# Patient Record
Sex: Male | Born: 2000 | Race: Black or African American | Hispanic: No | Marital: Single | Smoking: Never smoker
Health system: Southern US, Community
[De-identification: ages and names within clinical notes are randomized; demographics above are authoritative.]

## PROBLEM LIST (undated history)

## (undated) DIAGNOSIS — R011 Cardiac murmur, unspecified: Secondary | ICD-10-CM

## (undated) DIAGNOSIS — H539 Unspecified visual disturbance: Secondary | ICD-10-CM

## (undated) DIAGNOSIS — J45909 Unspecified asthma, uncomplicated: Secondary | ICD-10-CM

## (undated) DIAGNOSIS — J302 Other seasonal allergic rhinitis: Secondary | ICD-10-CM

## (undated) DIAGNOSIS — F909 Attention-deficit hyperactivity disorder, unspecified type: Secondary | ICD-10-CM

## (undated) HISTORY — PX: TONSILLECTOMY: SUR1361

## (undated) HISTORY — PX: ADENOIDECTOMY: SUR15

## (undated) HISTORY — DX: Other seasonal allergic rhinitis: J30.2

---
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
## 2019-01-04 ENCOUNTER — Emergency Department (HOSPITAL_BASED_OUTPATIENT_CLINIC_OR_DEPARTMENT_OTHER)
Admission: EM | Admit: 2019-01-04 | Discharge: 2019-01-04 | Disposition: A | Discharge status: Home / Self Care | Payer: Medicaid Other | Attending: Emergency Medicine | Admitting: Emergency Medicine

## 2019-01-04 ENCOUNTER — Emergency Department (HOSPITAL_BASED_OUTPATIENT_CLINIC_OR_DEPARTMENT_OTHER): Payer: Medicaid Other

## 2019-01-04 ENCOUNTER — Encounter (HOSPITAL_BASED_OUTPATIENT_CLINIC_OR_DEPARTMENT_OTHER): Payer: Self-pay | Admitting: *Deleted

## 2019-01-04 ENCOUNTER — Other Ambulatory Visit: Payer: Self-pay

## 2019-01-04 DIAGNOSIS — Z23 Encounter for immunization: Secondary | ICD-10-CM | POA: Diagnosis not present

## 2019-01-04 DIAGNOSIS — M25521 Pain in right elbow: Secondary | ICD-10-CM | POA: Diagnosis present

## 2019-01-04 DIAGNOSIS — Z79899 Other long term (current) drug therapy: Secondary | ICD-10-CM | POA: Insufficient documentation

## 2019-01-04 DIAGNOSIS — S59901A Unspecified injury of right elbow, initial encounter: Secondary | ICD-10-CM

## 2019-01-04 MED ORDER — TETANUS-DIPHTH-ACELL PERTUSSIS 5-2.5-18.5 LF-MCG/0.5 IM SUSP
0.5000 mL | Freq: Once | INTRAMUSCULAR | Status: AC
  Administered 2019-01-04: 0.5 mL via INTRAMUSCULAR
  Filled 2019-01-04: qty 0.5

## 2019-01-04 NOTE — Discharge Instructions (Signed)
As discussed, you x-ray is normal with no broken bones. You may take over the counter ibuprofen or tylenol as needed for pain and swelling. You can ice your elbow to help with inflammation. Follow-up with your PCP within the next week if your symptoms do not improve. Return to the ER for new or worsening symptoms.

## 2019-01-04 NOTE — ED Triage Notes (Signed)
Lift gate on large truck fell against rt elbow yesterday good movement  Slight swelling

## 2019-01-04 NOTE — ED Provider Notes (Signed)
[facility] EMERGENCY DEPARTMENT Provider Note   CSN: [id number] Arrival date & time: 01/04/19  1317     History   Chief Complaint Chief Complaint  Patient presents with  . Extremity Pain    HPI Nicholas Mcguire is a 18 y.o. male with a past medical history significant for ADHD, asthma, and seasonal allergies who presents to the ED for an evaluation of sudden onset of worsening right elbow pain x1 day. Patient notes he was at work when a lift gate from a truck fell directly on his right elbow yesterday. Patient is a Research scientist (physical sciences)  Patient notes he continued to work after the injury but had significant amount of pain.  He notes pain has drastically improved today.  Elbow pain is associated with mild swelling.  Patient denies numbness and tingling.  He rates his pain a 4/10.  He has not tried anything for pain prior to arrival.  Patient also sustained an abrasion to his right side of chest during the accident.  Patient unsure when his last tetanus shot was. Patient denies numbness/tingling and weakness.  Past Medical History:  Diagnosis Date  . ADHD (attention deficit hyperactivity disorder)   . Asthma    exercise induced asthma - no longer on inhalers  . Heart murmur    as a baby  . Premature baby   . Seasonal allergies   . Vision abnormalities    lazy eye    Patient Active Problem List   Diagnosis Date Noted  . Major depressive disorder, single episode, in remission ([place]) 07/17/2012  . ADHD (attention deficit hyperactivity disorder), combined type 07/17/2012    Past Surgical History:  Procedure Laterality Date  . ADENOIDECTOMY  age 93 mos.  Marland Kitchen PTOSIS REPAIR Left 09/01/2017   Procedure: PTOSIS REPAIR;  Surgeon: Clista Bernhardt, MD;  Location: [facility];  Service: Ophthalmology;  Laterality: Left;  . TONSILLECTOMY  age 2 mos.        Home Medications    Prior to Admission medications   Medication Sig Start Date End Date Taking? Authorizing Provider   amphetamine-dextroamphetamine (ADDERALL XR) 20 MG 24 hr capsule Take one each morning Patient not taking: Reported on 07/15/2017 08/26/16   Ethelda Chick, MD  amphetamine-dextroamphetamine (ADDERALL XR) 30 MG 24 hr capsule Take 1 capsule (30 mg total) by mouth daily. dnfu 07/21/2016 Patient not taking: Reported on 07/15/2017 05/24/16   Dara Hoyer, PA-C  mirtazapine (REMERON SOL-TAB) 15 MG disintegrating tablet Take 1 tablet (15 mg total) by mouth at bedtime. Patient not taking: Reported on 07/15/2017 08/26/16   Ethelda Chick, MD    Family History Family History  Problem Relation Age of Onset  . Bipolar disorder Mother   . Schizophrenia Sister   . Seizures Sister     Social History Social History   Tobacco Use  . Smoking status: Never Smoker  . Smokeless tobacco: Never Used  Substance Use Topics  . Alcohol use: No  . Drug use: No     Allergies   Patient has no known allergies.   Review of Systems Review of Systems  Constitutional: Negative for chills and fever.  Musculoskeletal: Positive for arthralgias and joint swelling.  Skin: Positive for color change and wound.  Neurological: Negative for weakness and numbness.     Physical Exam Updated Vital Signs BP 130/70 (BP Location: Left Arm)   Pulse 88   Temp 98.2 F (36.8 C)   Resp 16   Ht 5\' 7"  (  1.702 m)   Wt 78 kg   SpO2 100%   BMI 26.94 kg/m   Physical Exam Vitals signs and nursing note reviewed.  Constitutional:      General: He is not in acute distress.    Appearance: He is not ill-appearing.  HENT:     Head: Normocephalic.  Eyes:     Pupils: Pupils are equal, round, and reactive to light.  Neck:     Musculoskeletal: Neck supple.  Cardiovascular:     Rate and Rhythm: Normal rate and regular rhythm.     Pulses: Normal pulses.     Heart sounds: Normal heart sounds. No murmur. No friction rub. No gallop.   Pulmonary:     Effort: Pulmonary effort is normal.     Breath sounds: Normal breath sounds.   Abdominal:     General: Abdomen is flat. There is no distension.     Palpations: Abdomen is soft.     Tenderness: There is abdominal tenderness. There is no guarding or rebound.  Musculoskeletal:     Comments: Tenderness to palpation over right elbow posterior to olecranon process with mild surrounding edema. No bony tenderness. Full ROM of elbow, shoulder, and wrist. No warmth. Neurovascularly intact. Soft compartments.  Skin:    General: Skin is warm and dry.     Capillary Refill: Capillary refill takes less than 2 seconds.     Findings: Lesion present.     Comments: Small, shallow abrasion on right pectoral region. No surrounding erythema or edema.  Neurological:     General: No focal deficit present.     Mental Status: He is alert.      ED Treatments / Results  Labs (all labs ordered are listed, but only abnormal results are displayed) Labs Reviewed - No data to display  EKG None  Radiology Dg Elbow Complete Right  Result Date: 01/04/2019 CLINICAL DATA:  Pain after elbow hit by piece of metal EXAM: RIGHT ELBOW - COMPLETE 3+ VIEW COMPARISON:  None. FINDINGS: Frontal, lateral, and bilateral oblique views were obtained. No fracture or dislocation. No joint effusion. No joint space narrowing or erosion. IMPRESSION: No fracture or dislocation.  No evident arthropathy. Electronically Signed   By: Bretta Bang III M.D.   On: 01/04/2019 14:10    Procedures Procedures (including critical care time)  Medications Ordered in ED Medications  Tdap (BOOSTRIX) injection 0.5 mL (0.5 mLs Intramuscular Given 01/04/19 1353)     Initial Impression / Assessment and Plan / ED Course  I have reviewed the triage vital signs and the nursing notes.  Pertinent labs & imaging results that were available during my care of the patient were reviewed by me and considered in my medical decision making (see chart for details).       18 year old male presents to ED for evaluation of traumatic  right elbow pain. Vitals all within normal limits. Patient in no acute distress and non-ill appearing. Right elbow with tenderness to palpation posterior to olecranon process with mild edema. No erythema or warmth. Full ROM. Soft compartments. Neurovascularly intact. Right elbow x-ray personally reviewed which is negative for bony fracture and dislocation. Tetanus shot updated given abrasion to right chest. No concern for cellulitis at this time. Hemostasis achieved. No sutures or closure warranted at this time given how shallow it is. Will treat symptomatically with over the counter ibuprofen and tylenol. Patient advised to follow-up with PCP within the next week if symptoms do not improve. Strict ED precautions  discussed with patient. Patient states understanding and agrees to plan. Patient discharged home in no acute distress and stable vitals.  Final Clinical Impressions(s) / ED Diagnoses   Final diagnoses:  Injury of right elbow, initial encounter    ED Discharge Orders    None       Cheek, CarolinRenee Hardere B, PA-C 01/04/19 1954    Virgina Norfolkuratolo, Adam, DO 01/05/19 1225

## 2019-02-16 ENCOUNTER — Encounter: Payer: Self-pay | Admitting: Emergency Medicine

## 2019-02-16 ENCOUNTER — Other Ambulatory Visit: Payer: Self-pay

## 2019-02-16 ENCOUNTER — Ambulatory Visit
Admission: EM | Admit: 2019-02-16 | Discharge: 2019-02-16 | Disposition: A | Discharge status: Home / Self Care | Payer: Medicaid Other | Attending: Emergency Medicine | Admitting: Emergency Medicine

## 2019-02-16 DIAGNOSIS — Z20822 Contact with and (suspected) exposure to covid-19: Secondary | ICD-10-CM

## 2019-02-16 NOTE — ED Notes (Signed)
Patient able to ambulate independently  

## 2019-02-16 NOTE — ED Triage Notes (Signed)
Pt presents to [facility] for assessment of sore throat, cough, sneezing, runny nose, and muscle pain x 2-3 days.  Mother, who he lives with, tested positive for COVID.  Patient states today he developed dulled taste sensation.

## 2019-02-16 NOTE — ED Provider Notes (Signed)
[facility]    CSN: [id number] Arrival date & time: 02/16/19  1557      History   Chief Complaint Chief Complaint  Patient presents with  . Exposure to COVID    HPI Nicholas Mcguire is a 19 y.o. male w/ h/o asthma, adhd, allergies  Presenting for Covid testing: Exposure: mom Date of exposure: [place] Any fever, symptoms since exposure: yes - sore throat, dry cough, myalgias x 3 days.  Has not taken anything for symptoms.   Past Medical History:  Diagnosis Date  . ADHD (attention deficit hyperactivity disorder)   . Asthma    exercise induced asthma - no longer on inhalers  . Heart murmur    as a baby  . Premature baby   . Seasonal allergies   . Vision abnormalities    lazy eye    Patient Active Problem List   Diagnosis Date Noted  . Major depressive disorder, single episode, in remission (HCC) 07/17/2012  . ADHD (attention deficit hyperactivity disorder), combined type 07/17/2012    Past Surgical History:  Procedure Laterality Date  . ADENOIDECTOMY  age 77 mos.  Marland Kitchen PTOSIS REPAIR Left 09/01/2017   Procedure: PTOSIS REPAIR;  Surgeon: Floydene Flock, MD;  Location: [facility] OR;  Service: Ophthalmology;  Laterality: Left;  . TONSILLECTOMY  age 58 mos.       Home Medications    Prior to Admission medications   Medication Sig Start Date End Date Taking? Authorizing Provider  amphetamine-dextroamphetamine (ADDERALL XR) 30 MG 24 hr capsule Take 1 capsule (30 mg total) by mouth daily. dnfu 07/21/2016 05/24/16  Yes Court Joy, PA-C  mirtazapine (REMERON SOL-TAB) 15 MG disintegrating tablet Take 1 tablet (15 mg total) by mouth at bedtime. 08/26/16  Yes Gentry Fitz, MD  amphetamine-dextroamphetamine (ADDERALL XR) 20 MG 24 hr capsule Take one each morning Patient not taking: Reported on 07/15/2017 08/26/16   Gentry Fitz, MD    Family History Family History  Problem Relation Age of Onset  . Bipolar disorder Mother   . Schizophrenia Sister   .  Seizures Sister     Social History Social History   Tobacco Use  . Smoking status: Never Smoker  . Smokeless tobacco: Never Used  Substance Use Topics  . Alcohol use: No  . Drug use: No     Allergies   Patient has no known allergies.   Review of Systems Review of Systems  Constitutional: Negative for activity change, appetite change, fatigue and fever.  HENT: Positive for sore throat. Negative for congestion, dental problem, ear pain, facial swelling, hearing loss, sinus pain, trouble swallowing and voice change.   Eyes: Negative for photophobia, pain and visual disturbance.  Respiratory: Positive for cough. Negative for shortness of breath, wheezing and stridor.   Cardiovascular: Negative for chest pain and palpitations.  Gastrointestinal: Negative for diarrhea and vomiting.  Musculoskeletal: Positive for myalgias. Negative for arthralgias, neck pain and neck stiffness.  Neurological: Negative for dizziness and headaches.     Physical Exam Triage Vital Signs ED Triage Vitals  Enc Vitals Group     BP 02/16/19 1612 123/77     Pulse Rate 02/16/19 1612 85     Resp 02/16/19 1612 16     Temp 02/16/19 1612 98.5 F (36.9 C)     Temp Source 02/16/19 1612 Temporal     SpO2 02/16/19 1612 97 %     Weight --      Height --  Head Circumference --      Peak Flow --      Pain Score 02/16/19 1613 0     Pain Loc --      Pain Edu? --      Excl. in GC? --    No data found.  Updated Vital Signs BP 123/77 (BP Location: Left Arm)   Pulse 85   Temp 98.5 F (36.9 C) (Temporal)   Resp 16   SpO2 97%   Visual Acuity Right Eye Distance:   Left Eye Distance:   Bilateral Distance:    Right Eye Near:   Left Eye Near:    Bilateral Near:     Physical Exam Constitutional:      General: He is not in acute distress.    Appearance: He is not toxic-appearing or diaphoretic.  HENT:     Head: Normocephalic and atraumatic.     Mouth/Throat:     Mouth: Mucous membranes are  moist.     Pharynx: Oropharynx is clear. No oropharyngeal exudate or posterior oropharyngeal erythema.  Eyes:     General: No scleral icterus.    Conjunctiva/sclera: Conjunctivae normal.     Pupils: Pupils are equal, round, and reactive to light.  Neck:     Comments: Trachea midline, negative JVD Cardiovascular:     Rate and Rhythm: Normal rate and regular rhythm.  Pulmonary:     Effort: Pulmonary effort is normal. No respiratory distress.     Breath sounds: No wheezing.  Musculoskeletal:     Cervical back: Neck supple. No tenderness.  Lymphadenopathy:     Cervical: No cervical adenopathy.  Skin:    Capillary Refill: Capillary refill takes less than 2 seconds.     Coloration: Skin is not jaundiced or pale.     Findings: No rash.  Neurological:     Mental Status: He is alert and oriented to person, place, and time.      UC Treatments / Results  Labs (all labs ordered are listed, but only abnormal results are displayed) Labs Reviewed  NOVEL CORONAVIRUS, NAA    EKG   Radiology No results found.  Procedures Procedures (including critical care time)  Medications Ordered in UC Medications - No data to display  Initial Impression / Assessment and Plan / UC Course  I have reviewed the triage vital signs and the nursing notes.  Pertinent labs & imaging results that were available during my care of the patient were reviewed by me and considered in my medical decision making (see chart for details).     Patient afebrile, nontoxic, with SpO2 97%.  Covid PCR pending.  Patient to quarantine until results are back.  We will continue supportive management.  Return precautions discussed, patient verbalized understanding and is agreeable to plan. Final Clinical Impressions(s) / UC Diagnoses   Final diagnoses:  Exposure to COVID-19 virus     Discharge Instructions     Your COVID test is pending - it is important to quarantine / isolate at home until your results are  back. If you test positive and would like further evaluation for persistent or worsening symptoms, you may schedule an E-visit or virtual (video) visit throughout the [facility] app or website.  PLEASE NOTE: If you develop severe chest pain or shortness of breath please go to the ER or call 9-1-1 for further evaluation --> DO NOT schedule electronic or virtual visits for this. Please call our office for further guidance / recommendations as needed.  ED Prescriptions    None     PDMP not reviewed this encounter.   Hall-Potvin, [place], [place] 02/16/19 1653

## 2019-02-16 NOTE — Discharge Instructions (Signed)
Your COVID test is pending - it is important to quarantine / isolate at home until your results are back. °If you test positive and would like further evaluation for persistent or worsening symptoms, you may schedule an E-visit or virtual (video) visit throughout the [facility] MyChart app or website. ° °PLEASE NOTE: If you develop severe chest pain or shortness of breath please go to the ER or call 9-1-1 for further evaluation --> DO NOT schedule electronic or virtual visits for this. °Please call our office for further guidance / recommendations as needed. °

## 2019-02-17 LAB — NOVEL CORONAVIRUS, NAA: SARS-CoV-2, NAA: DETECTED — AB

## 2019-02-19 ENCOUNTER — Telehealth (HOSPITAL_COMMUNITY): Payer: Self-pay | Admitting: Emergency Medicine

## 2019-02-19 NOTE — Telephone Encounter (Signed)

## 2019-08-03 ENCOUNTER — Ambulatory Visit (HOSPITAL_COMMUNITY)
Admission: EM | Admit: 2019-08-03 | Discharge: 2019-08-03 | Disposition: A | Discharge status: Home / Self Care | Payer: Medicaid Other | Attending: Internal Medicine | Admitting: Internal Medicine

## 2019-08-03 ENCOUNTER — Other Ambulatory Visit: Payer: Self-pay

## 2019-08-03 ENCOUNTER — Encounter (HOSPITAL_COMMUNITY): Payer: Self-pay | Admitting: Gynecology

## 2019-08-03 DIAGNOSIS — R091 Pleurisy: Secondary | ICD-10-CM

## 2019-08-03 DIAGNOSIS — R0789 Other chest pain: Secondary | ICD-10-CM | POA: Diagnosis not present

## 2019-08-03 DIAGNOSIS — S29011A Strain of muscle and tendon of front wall of thorax, initial encounter: Secondary | ICD-10-CM

## 2019-08-03 MED ORDER — TIZANIDINE HCL 4 MG PO TABS: 4.0000 mg | ORAL_TABLET | Freq: Three times a day (TID) | ORAL | 0 refills | Status: DC | PRN

## 2019-08-03 MED ORDER — NAPROXEN 500 MG PO TABS: 500.0000 mg | ORAL_TABLET | Freq: Two times a day (BID) | ORAL | 0 refills | Status: DC

## 2019-08-03 NOTE — ED Provider Notes (Signed)
[facility]   MRN: [id number] DOB: 2000/10/06  Subjective:   Nicholas Mcguire is a 19 y.o. male presenting for acute onset of mid to left-sided chest pain today while he was doing his push-ups workout routine.  Patient states that he did a preworkout and started to exercise, does push-ups with resistance.  He has been doing these kinds of exercise for the past 2 months.  States that he has not had this kind of pain before.  Symptoms are mild at present, intermittent for the most part, elicited with bending and extending at the level of the torso.  Has not taken any medications for the pain.  Denies history of heart conditions.  However, he did have a heart murmur as a child.  Denies history of sudden cardiac death in the family.  No current facility-administered medications for this encounter.  Current Outpatient Medications:  .  amphetamine-dextroamphetamine (ADDERALL XR) 20 MG 24 hr capsule, Take one each morning (Patient not taking: Reported on 07/15/2017), Disp: 30 capsule, Rfl: 0 .  amphetamine-dextroamphetamine (ADDERALL XR) 30 MG 24 hr capsule, Take 1 capsule (30 mg total) by mouth daily. dnfu 07/21/2016, Disp: 30 capsule, Rfl: 0 .  mirtazapine (REMERON SOL-TAB) 15 MG disintegrating tablet, Take 1 tablet (15 mg total) by mouth at bedtime., Disp: 90 tablet, Rfl: 1   No Known Allergies  Past Medical History:  Diagnosis Date  . ADHD (attention deficit hyperactivity disorder)   . Asthma    exercise induced asthma - no longer on inhalers  . Heart murmur    as a baby  . Premature baby   . Seasonal allergies   . Vision abnormalities    lazy eye     Past Surgical History:  Procedure Laterality Date  . ADENOIDECTOMY  age 53 mos.  Marland Kitchen PTOSIS REPAIR Left 09/01/2017   Procedure: PTOSIS REPAIR;  Surgeon: Floydene Flock, MD;  Location: [facility] OR;  Service: Ophthalmology;  Laterality: Left;  . TONSILLECTOMY  age 78 mos.    Family History  Problem Relation Age of Onset  . Bipolar  disorder Mother   . Schizophrenia Sister   . Seizures Sister     Social History   Tobacco Use  . Smoking status: Never Smoker  . Smokeless tobacco: Never Used  Vaping Use  . Vaping Use: Never used  Substance Use Topics  . Alcohol use: No  . Drug use: No    ROS   Objective:   Vitals: BP 132/69 (BP Location: Left Arm)   Pulse 68   Temp 98.6 F (37 C) (Oral)   Resp 16   Ht 5\' 7"  (1.702 m)   Wt 172 lb (78 kg)   SpO2 99%   BMI 26.94 kg/m   Physical Exam Constitutional:      General: He is not in acute distress.    Appearance: Normal appearance. He is well-developed. He is not ill-appearing, toxic-appearing or diaphoretic.  HENT:     Head: Normocephalic and atraumatic.     Right Ear: External ear normal.     Left Ear: External ear normal.     Nose: Nose normal.     Mouth/Throat:     Mouth: Mucous membranes are moist.     Pharynx: Oropharynx is clear.  Eyes:     General: No scleral icterus.    Extraocular Movements: Extraocular movements intact.     Pupils: Pupils are equal, round, and reactive to light.  Cardiovascular:     Rate and Rhythm:  Normal rate and regular rhythm.     Heart sounds: Normal heart sounds. No murmur heard.  No friction rub. No gallop.   Pulmonary:     Effort: Pulmonary effort is normal. No respiratory distress.     Breath sounds: Normal breath sounds. No stridor. No wheezing, rhonchi or rales.  Chest:     Chest wall: Tenderness (Focal tenderness over area outlined) present. No deformity, swelling, crepitus or edema.    Neurological:     Mental Status: He is alert and oriented to person, place, and time.  Psychiatric:        Mood and Affect: Mood normal.        Behavior: Behavior normal.        Thought Content: Thought content normal.     ED ECG REPORT   Date: 08/03/2019  Rate: 79bpm  Rhythm: normal sinus rhythm  QRS Axis: normal  Intervals: normal  ST/T Wave abnormalities: normal  Conduction Disutrbances:none  Narrative  Interpretation: Sinus rhythm at 79 bpm.  Old EKG Reviewed: unchanged  I have personally reviewed the EKG tracing and agree with the computerized printout as noted.   Assessment and Plan :   PDMP not reviewed this encounter.  1. Chest wall pain   2. Chest wall muscle strain, initial encounter     Will manage for chest wall strain related to his workout routine.  Recommended rest, naproxen and tizanidine. Counseled patient on potential for adverse effects with medications prescribed/recommended today, ER and return-to-clinic precautions discussed, patient verbalized understanding.    Wallis Bamberg, PA-C 08/04/19 1004

## 2019-08-03 NOTE — ED Triage Notes (Signed)
Per patient took pre-work out supplement drink  and while doing push up at the gym today starting to have chest pain. Per patient when breathe in he felt the pain.

## 2019-08-15 ENCOUNTER — Telehealth: Payer: Self-pay | Admitting: *Deleted

## 2019-08-15 NOTE — Telephone Encounter (Signed)
Patient was called back today due to a red Emmi, he is requesting no further calls and also says the system sounded really weird to him.Patient also states he does not need any further assistance at this time.

## 2021-01-23 IMAGING — CR DG ELBOW COMPLETE 3+V*R*
4 series · 4 of 4 positions shown · non-contrast
Comparison: None.

CLINICAL DATA: Pain after elbow hit by piece of metal

EXAM:
RIGHT ELBOW - COMPLETE 3+ VIEW

[x elbow joint ap right]
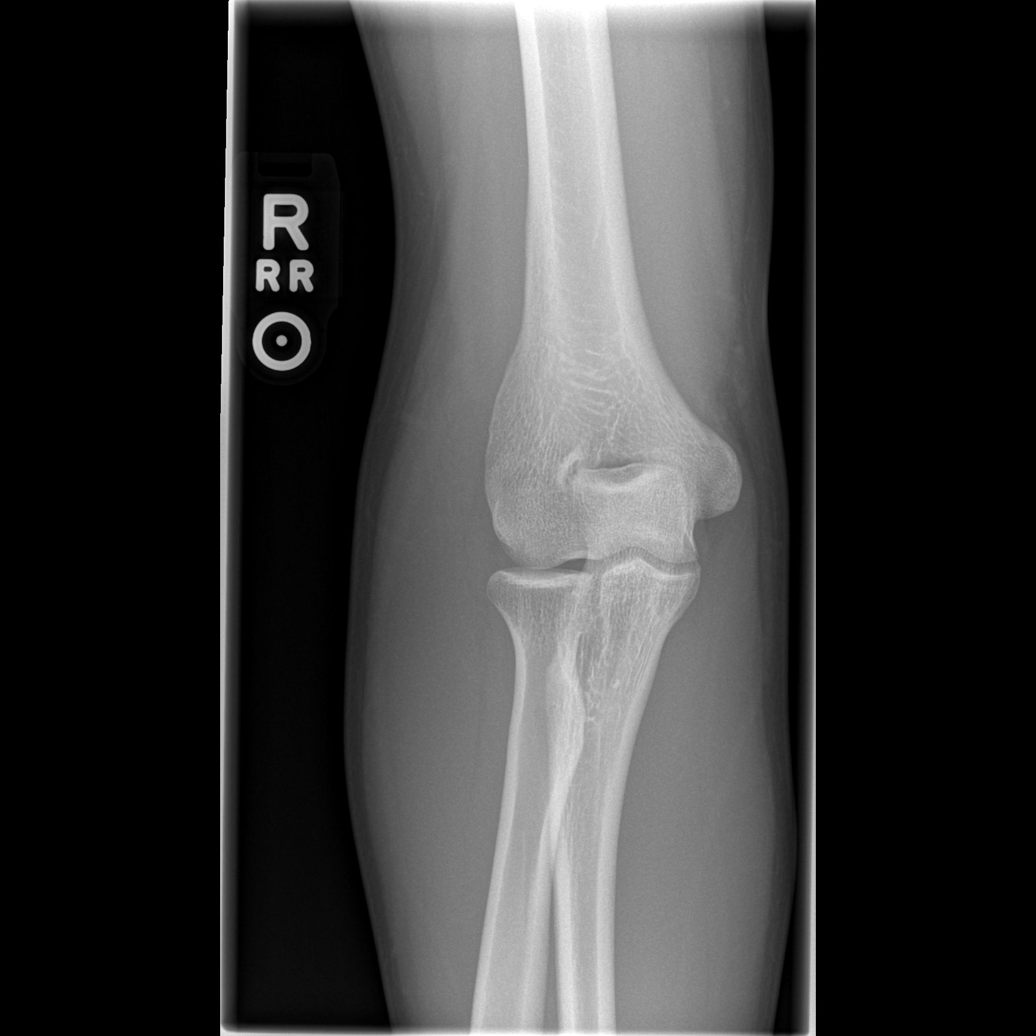

[x elbow joint obl. right (1 of 2)]
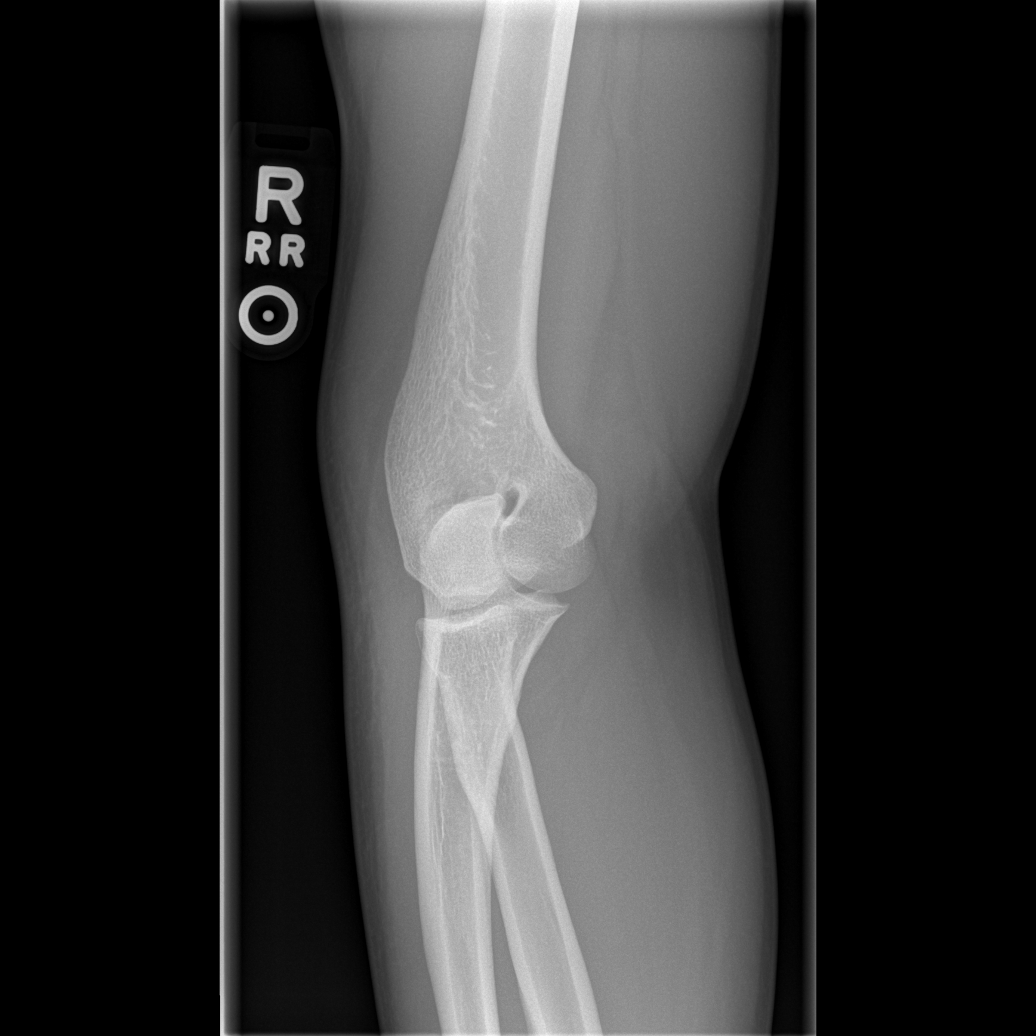

[x elbow joint obl. right (2 of 2)]
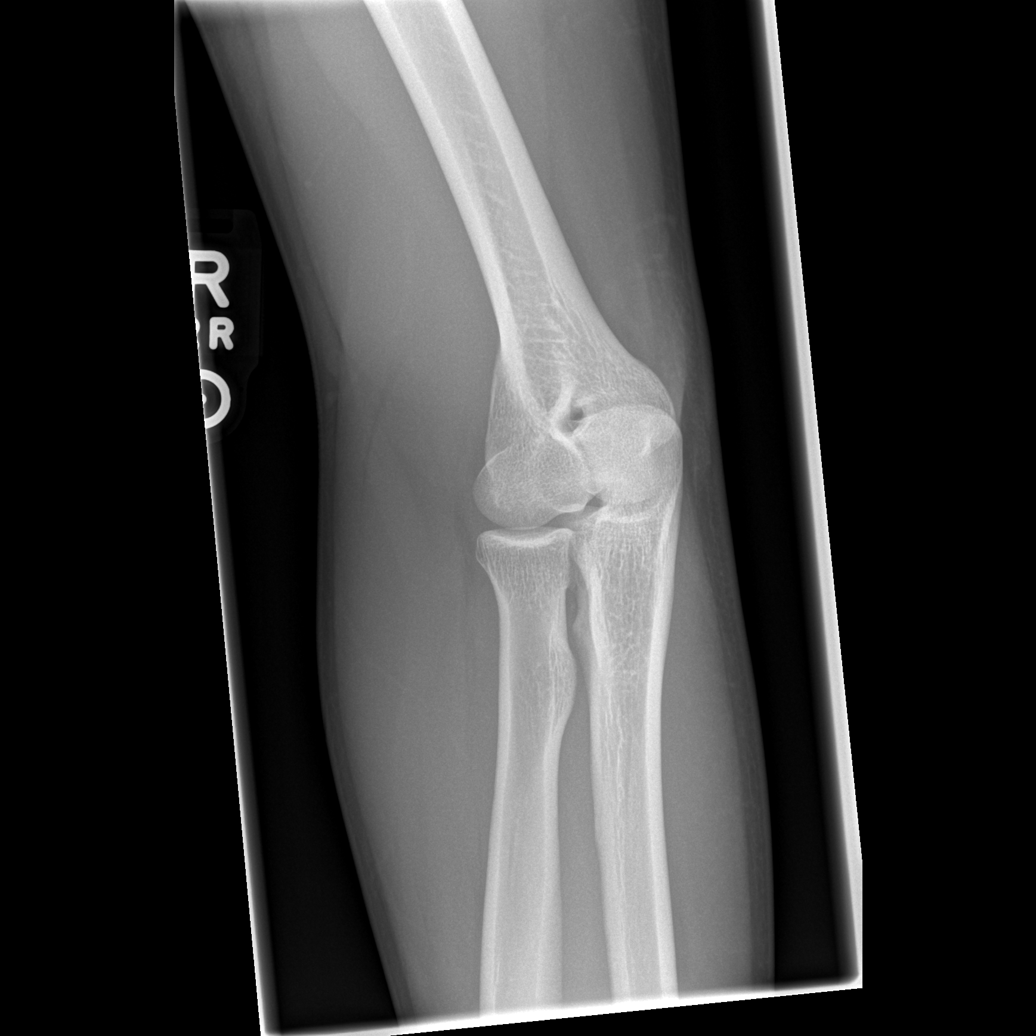

[x elbow joint lat right]
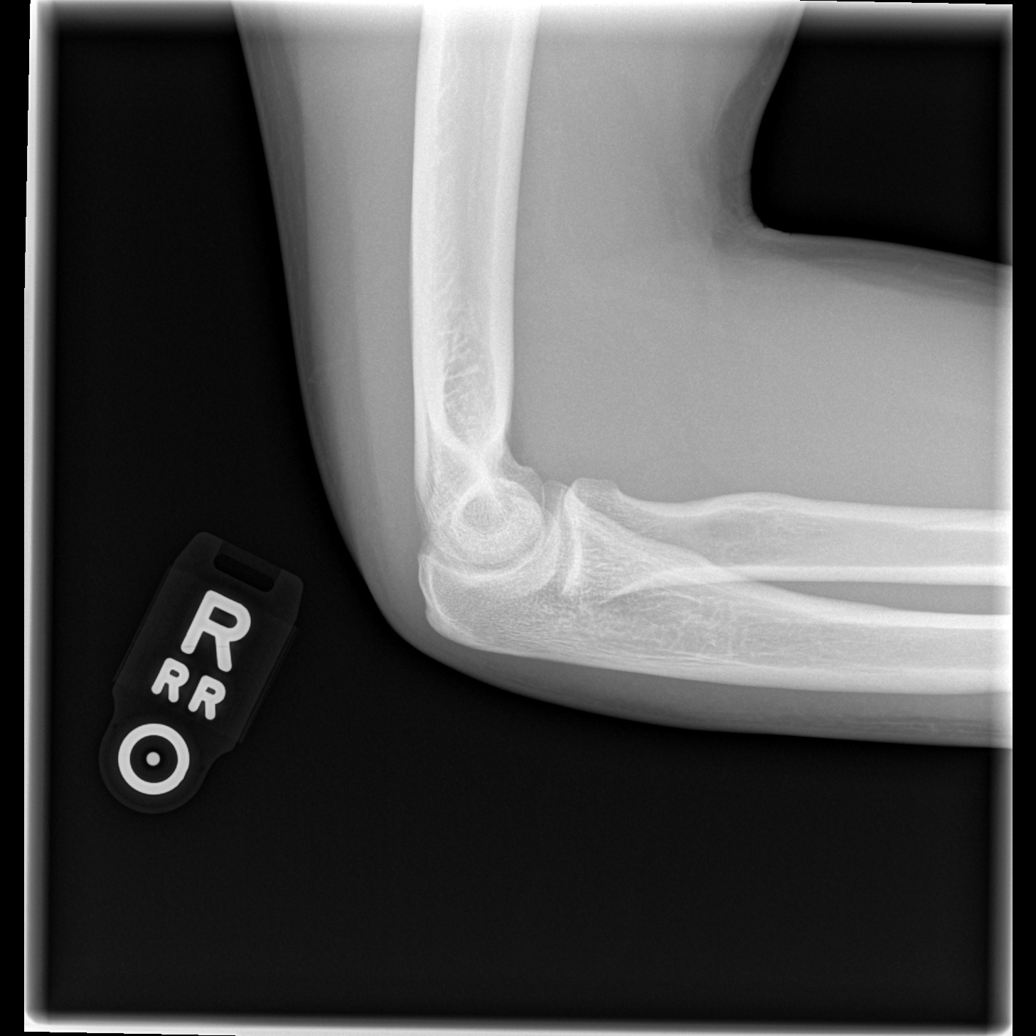

[4 of 4 positions shown; findings below may reference images not displayed]

FINDINGS: Frontal, lateral, and bilateral oblique views were obtained. No
fracture or dislocation. No joint effusion. No joint space narrowing
or erosion.
IMPRESSION: No fracture or dislocation.  No evident arthropathy.

## 2021-03-24 DIAGNOSIS — Q100 Congenital ptosis: Secondary | ICD-10-CM | POA: Diagnosis not present

## 2021-10-13 DIAGNOSIS — Q100 Congenital ptosis: Secondary | ICD-10-CM | POA: Diagnosis not present

## 2021-12-23 DIAGNOSIS — Q100 Congenital ptosis: Secondary | ICD-10-CM | POA: Diagnosis not present

## 2021-12-23 DIAGNOSIS — H02403 Unspecified ptosis of bilateral eyelids: Secondary | ICD-10-CM | POA: Diagnosis not present

## 2022-05-13 ENCOUNTER — Ambulatory Visit (HOSPITAL_COMMUNITY): Payer: Medicaid Other | Admitting: Medical

## 2022-05-13 ENCOUNTER — Other Ambulatory Visit (HOSPITAL_BASED_OUTPATIENT_CLINIC_OR_DEPARTMENT_OTHER): Payer: Medicaid Other | Admitting: Medical

## 2022-05-13 ENCOUNTER — Encounter (HOSPITAL_COMMUNITY): Payer: Self-pay

## 2022-05-13 DIAGNOSIS — F902 Attention-deficit hyperactivity disorder, combined type: Secondary | ICD-10-CM

## 2022-05-13 DIAGNOSIS — F325 Major depressive disorder, single episode, in full remission: Secondary | ICD-10-CM | POA: Diagnosis not present

## 2022-05-13 NOTE — Progress Notes (Signed)
Patient was Peds in 2016/2017/2018 ADHD amphetamine treatments. Last entry for me 04/23/2016 Saw Dr Milana Kidney 08/26/2016 Subjective: "I feel down when I take Adderall" HPI: Nicholas Mcguire is a 22yo male, rising junior, accompanied by his mother for follow-up, most recently seen by Maryjean Morn, PA.  He has a history of major depression with an inpatient admission to [facility] in 2014 with depressive sxs and suicidal ideation as well as a diagnosis of ADHD. He has been maintained on Remeron dissolvable tab 15mg  qhs since hospitalization with improvement in depression and remission of any active sxs. He denies depressed mood, SI, thoughts/acts of self-harm. 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  He is sleeping well with remeron and does not have excess daytime sedation.  He takes Adderall XR 30mg  qam for ADHD (previously on Metadate which became unavailable).  He has had good response to Adderall which he takes only on school days; the effect lasts until around 4pm.  Nicholas Mcguire expresses concern that he feels "down" while on adderall and friends will ask him what's wrong. Visit Diagnosis:      ICD-10-CM    1. Attention deficit hyperactivity disorder (ADHD), combined type F90.2    2. MDD (major depressive disorder), recurrent, in full remission (HCC) F33.42 mirtazapine (REMERON SOL-TAB) 15 MG disintegrating tablet      Past Psychiatric History: outpatient med management through [facility]; previous inpatient at [facility] in 2014 Treatment Plan Summary:Reviewed response to current meds. Continue mirtazapine 15mg  dissolvable tab qhs with depression remaining improved and sleep good. Discussed trying lower dose of Adderall XR (to 20mg ) when school starts to reduce emotional constriction. Discussed what to watch for in terms of his schoolwork and behavior that would indicate the 30mg  dose is most appropriate. Return Oct 20 mins with  patient with greater than 50% counseling as above. Danelle Berry, MD 08/26/2016, 9:08 DID NOT RETURN Care Everywhere [facility] 5[facility]8?2021 COVID [facility] Encounter Details  Encounter Details Date Type Department Care Team Description  01/18/2017 Office Visit [facility] Sherian Maroon [facility]  [facility]  [address]  [place], [place] [address]  [phone]  Thedore Mins, MD  [address]  [id number]  [place], [place] [address]  [phone]  [phone] (Fax)  Musculoskeletal chest pain (Primary Dx)    Spoke briefly by video to determine his reason for return-"My ADHD" Informed he will need an in person visit and Vitals and UDS and new paperwork Scheduled for Monday 05/17/2022 3:00pm at [facility]

## 2022-05-17 ENCOUNTER — Ambulatory Visit (HOSPITAL_COMMUNITY): Payer: Self-pay | Admitting: Medical

## 2022-07-06 ENCOUNTER — Telehealth (HOSPITAL_COMMUNITY): Payer: Self-pay | Admitting: Medical

## 2022-07-07 ENCOUNTER — Telehealth (HOSPITAL_COMMUNITY): Payer: Self-pay | Admitting: Medical

## 2022-07-07 NOTE — Telephone Encounter (Signed)
Pt called Appt Desk at [facility] OP and spoke with [facility] about a bill he received regarding his phone call on 06/12/2022. He claimed he "never" spoke with this provider about his medication. He was referred to the Billing Department A copy of visit note will be sent to patient marked "Confidential"

## 2022-07-07 NOTE — Telephone Encounter (Deleted)
Pt called Appt Desk at [facility] OP and spoke with [facility] about a bill he received regarding his phone call on 06/12/2022. He claimed he "never" spoke with this provider about his medication.

## 2022-07-26 ENCOUNTER — Telehealth (HOSPITAL_COMMUNITY): Payer: Self-pay | Admitting: Medical

## 2022-07-29 ENCOUNTER — Telehealth (HOSPITAL_COMMUNITY): Payer: Self-pay | Admitting: Medical

## 2022-07-30 ENCOUNTER — Telehealth (HOSPITAL_COMMUNITY): Payer: Self-pay

## 2022-07-30 NOTE — Telephone Encounter (Signed)
Advice - Telephone call with patient to verify his bill for 05/13/22 had been cleared and that he now had 0 balance. Patient to call back if any other issues with later bills for this date of service.

## 2022-07-30 NOTE — Telephone Encounter (Signed)
Advice - Telephone message left for patient that this nurse and Behavioral Health Outpatient Clinical Manager had gotten a message about his concern for a past bill received 05/13/22. Informed would check into the bill and that pt could call back or I would try to contact him back once more information available about past bill.

## 2022-07-30 NOTE — Telephone Encounter (Signed)
Advice - Telephone call with patient, after he left a message to call him back following missing an earlier call by this nurse manager today. Patient discussed his problems with a bill he received for services 05/13/22 and not seen that day, as was informed right after he joined the meeting he would need to reschedule for an office visit.  Reviewed patient concerns and note from 05/13/22 and agreed to follow up on this concern for patient with billing department to try to resolve patient concerns.

## 2022-08-13 ENCOUNTER — Ambulatory Visit (HOSPITAL_COMMUNITY)
Admission: EM | Admit: 2022-08-13 | Discharge: 2022-08-13 | Disposition: A | Discharge status: Home / Self Care | Payer: Medicaid Other | Attending: Emergency Medicine | Admitting: Emergency Medicine

## 2022-08-13 ENCOUNTER — Encounter (HOSPITAL_COMMUNITY): Payer: Self-pay

## 2022-08-13 DIAGNOSIS — R202 Paresthesia of skin: Secondary | ICD-10-CM

## 2022-08-13 LAB — POCT FASTING CBG KUC MANUAL ENTRY: POCT Glucose (KUC): 95 mg/dL (ref 70–99)

## 2022-08-13 LAB — POCT URINALYSIS DIP (MANUAL ENTRY)
Bilirubin, UA: NEGATIVE
Blood, UA: NEGATIVE
Glucose, UA: NEGATIVE mg/dL
Ketones, POC UA: NEGATIVE mg/dL
Leukocytes, UA: NEGATIVE
Nitrite, UA: NEGATIVE
Protein Ur, POC: NEGATIVE mg/dL
Spec Grav, UA: 1.02 (ref 1.010–1.025)
Urobilinogen, UA: 0.2 E.U./dL
pH, UA: 6.5 (ref 5.0–8.0)

## 2022-08-13 NOTE — ED Provider Notes (Signed)
[facility]    CSN: [id number] Arrival date & time: 08/13/22  4098      History   Chief Complaint Chief Complaint  Patient presents with   Tingling    HPI Nicholas Mcguire is a 22 y.o. male.   Patient presents to clinic over concerns and tingling in his lower legs and his distal arms.  He thinks he overall had too much sugar yesterday, was at a cookout eating a lot of chips and soda.  He did have tingling in his lower legs throughout the day yesterday, he went to bed around 3 AM when he woke up he felt tingling in his lower arms as well.  Reports he is moving across the country to [place] within the next day or so and would like to be evaluated.  He is concerned because he has a strong family history of type 2 diabetes.  He does not have a primary care provider and does not get routine medical care.  He is not taking any daily medications.  Denies any weakness, falls, or trauma.    The history is provided by the patient and medical records.    Past Medical History:  Diagnosis Date   ADHD (attention deficit hyperactivity disorder)    Asthma    exercise induced asthma - no longer on inhalers   Heart murmur    as a baby   Premature baby    Seasonal allergies    Vision abnormalities    lazy eye    Patient Active Problem List   Diagnosis Date Noted   Musculoskeletal chest pain 01/18/2017   Major depressive disorder, single episode, in remission (HCC) 07/17/2012   ADHD (attention deficit hyperactivity disorder), combined type 07/17/2012    Past Surgical History:  Procedure Laterality Date   ADENOIDECTOMY  age 65 mos.   PTOSIS REPAIR Left 09/01/2017   Procedure: PTOSIS REPAIR;  Surgeon: Floydene Flock, MD;  Location: [facility] OR;  Service: Ophthalmology;  Laterality: Left;   TONSILLECTOMY  age 54 mos.       Home Medications    Prior to Admission medications   Not on File    Family History Family History  Problem Relation Age of Onset   Bipolar  disorder Mother    Schizophrenia Sister    Seizures Sister     Social History Social History   Tobacco Use   Smoking status: Never   Smokeless tobacco: Never  Vaping Use   Vaping status: Never Used  Substance Use Topics   Alcohol use: No   Drug use: No     Allergies   Patient has no known allergies.   Review of Systems Review of Systems  Constitutional:  Negative for fever.  Neurological:  Negative for tremors, syncope, weakness and numbness.     Physical Exam Triage Vital Signs ED Triage Vitals  Encounter Vitals Group     BP 08/13/22 0821 138/86     Systolic BP Percentile --      Diastolic BP Percentile --      Pulse Rate 08/13/22 0821 71     Resp 08/13/22 0821 16     Temp 08/13/22 0821 98.6 F (37 C)     Temp Source 08/13/22 0821 Oral     SpO2 08/13/22 0821 97 %     Weight 08/13/22 0821 181 lb (82.1 kg)     Height 08/13/22 0821 5\' 7"  (1.702 m)     Head Circumference --  Peak Flow --      Pain Score 08/13/22 0820 3     Pain Loc --      Pain Education --      Exclude from Growth Chart --    No data found.  Updated Vital Signs BP 138/86 (BP Location: Left Arm)   Pulse 71   Temp 98.6 F (37 C) (Oral)   Resp 16   Ht 5\' 7"  (1.702 m)   Wt 181 lb (82.1 kg)   SpO2 97%   BMI 28.35 kg/m   Visual Acuity Right Eye Distance:   Left Eye Distance:   Bilateral Distance:    Right Eye Near:   Left Eye Near:    Bilateral Near:     Physical Exam Vitals and nursing note reviewed.  Constitutional:      Appearance: Normal appearance.  HENT:     Head: Normocephalic and atraumatic.     Right Ear: External ear normal.     Left Ear: External ear normal.     Nose: Nose normal.     Mouth/Throat:     Mouth: Mucous membranes are moist.  Eyes:     Conjunctiva/sclera: Conjunctivae normal.  Cardiovascular:     Rate and Rhythm: Normal rate and regular rhythm.     Pulses: Normal pulses.     Heart sounds: Normal heart sounds.  Pulmonary:     Effort:  Pulmonary effort is normal.     Breath sounds: Normal breath sounds.  Musculoskeletal:        General: Normal range of motion.     Cervical back: Normal range of motion.  Skin:    General: Skin is warm and dry.     Capillary Refill: Capillary refill takes less than 2 seconds.  Neurological:     General: No focal deficit present.     Mental Status: He is alert and oriented to person, place, and time.     GCS: GCS eye subscore is 4. GCS verbal subscore is 5. GCS motor subscore is 6.     Cranial Nerves: Cranial nerves 2-12 are intact. No cranial nerve deficit.     Sensory: No sensory deficit.     Motor: No weakness.     Gait: Gait normal.  Psychiatric:        Mood and Affect: Mood normal.        Behavior: Behavior is cooperative.      UC Treatments / Results  Labs (all labs ordered are listed, but only abnormal results are displayed) Labs Reviewed  POCT URINALYSIS DIP (MANUAL ENTRY)  POCT FASTING CBG [facility] MANUAL ENTRY    EKG   Radiology No results found.  Procedures Procedures (including critical care time)  Medications Ordered in UC Medications - No data to display  Initial Impression / Assessment and Plan / UC Course  I have reviewed the triage vital signs and the nursing notes.  Pertinent labs & imaging results that were available during my care of the patient were reviewed by me and considered in my medical decision making (see chart for details).  Vitals and triage reviewed, patient is hemodynamically stable.  Neurologically intact, GCS 15, cranial nerves II through XII grossly intact.  CBG 95 in clinic, UA unremarkable.  Physical exam reassuring.  Do not feel as if symptoms were related to diabetes or hyperglycemia at this time.  Encouraged balanced diet and routine exercise.  Plan of care, follow-up care and return precautions given, no questions at this time.  Final Clinical Impressions(s) / UC Diagnoses   Final diagnoses:  Paresthesia of upper and lower  extremities of both sides     Discharge Instructions      Your blood sugar was normal today in clinic.  Your urine did not show any signs of glucose as well.  Your numbness and tingling could have been from a transient increase in blood sugar, overall your physical exam is reassuring.  Please ensure you are eating a balanced diet with plenty of fruits and vegetables.  It is important that once you get to [place] you establish a primary care provider, who can do routine labs and assessments.  Since you have a strong family history of diabetes it is important that you have a balanced diet and you are getting routine exercise to help prevent type 2 diabetes.  Please return to clinic for new or urgent symptoms.      ED Prescriptions   None    PDMP not reviewed this encounter.   Rinaldo Ratel [place], [place] 08/13/22 [phone]

## 2022-08-13 NOTE — Discharge Instructions (Addendum)
Your blood sugar was normal today in clinic.  Your urine did not show any signs of glucose as well.  Your numbness and tingling could have been from a transient increase in blood sugar, overall your physical exam is reassuring.  Please ensure you are eating a balanced diet with plenty of fruits and vegetables.  It is important that once you get to [place] you establish a primary care provider, who can do routine labs and assessments.  Since you have a strong family history of diabetes it is important that you have a balanced diet and you are getting routine exercise to help prevent type 2 diabetes.  Please return to clinic for new or urgent symptoms.

## 2022-08-13 NOTE — ED Triage Notes (Signed)
Patient here today with c/o tingling in arms and legs since yesterday after going to a cookout drinking lots of sodas. He thinks it is because he eats a lot of sugar.

## 2023-01-18 ENCOUNTER — Ambulatory Visit (HOSPITAL_BASED_OUTPATIENT_CLINIC_OR_DEPARTMENT_OTHER): Payer: Self-pay | Admitting: Family

## 2023-01-18 DIAGNOSIS — R41840 Attention and concentration deficit: Secondary | ICD-10-CM | POA: Diagnosis not present

## 2023-01-18 DIAGNOSIS — F325 Major depressive disorder, single episode, in full remission: Secondary | ICD-10-CM

## 2023-01-18 MED ORDER — ATOMOXETINE HCL 10 MG PO CAPS: ORAL_CAPSULE | ORAL | 0 refills | Status: AC

## 2023-01-18 NOTE — Addendum Note (Signed)
Addended by: Oneta Rack on: 01/18/2023 03:00 PM   Modules accepted: Level of Service

## 2023-01-18 NOTE — Progress Notes (Signed)
Virtual Visit via Video Note  I connected with Nicholas Mcguire on 01/18/23 at 10:30 AM EST by a video enabled telemedicine application and verified that I am speaking with the correct person using two identifiers.  Location: Patient: Home Provider: Office   I discussed the limitations of evaluation and management by telemedicine and the availability of in person appointments. The patient expressed understanding and agreed to proceed.    I discussed the assessment and treatment plan with the patient. The patient was provided an opportunity to ask questions and all were answered. The patient agreed with the plan and demonstrated an understanding of the instructions.   The patient was advised to call back or seek an in-person evaluation if the symptoms worsen or if the condition fails to improve as anticipated.  I provided 00 minutes of non-face-to-face time during this encounter.   Nicholas Rack, NP    Psychiatric Initial Adult Assessment   Patient Identification: Nicholas Mcguire MRN:  [id number] Date of Evaluation:  01/18/2023 Referral Source: Reestablish care Chief Complaint:  " I  just needed to be restarted on my Adderall"  Visit Diagnosis:    ICD-10-CM   1. Major depressive disorder, single episode, in remission (HCC)  F32.5     2. Poor concentration  R41.840       History of Present Illness:  Nicholas Mcguire 22 year old male presents to reestablish care.  Reports he was followed by physician assistants Nicholas Mcguire where he has a charted diagnosis related to major depressive disorder and attention deficit disorder.  States he was prescribed Remeron and Adderall in the past however has not had medications for the past few years.  He reports he is interested in restarting Adderall due to high demand of work.  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  States his job is high pace,   demanding and can be stressful.  States recently he was advised that he will be on-call for 24 hours  and would like something to help with poor concentration, inability to focus, forgetfulness and excessive fidgetiness.  Nicholas Mcguire reported graduating from a online college at [employer].  Reports he completed his bachelorette degree and is currently seeking to complete his masters degree.    Denied illicit drug use or substance abuse history.  Reported previous inpatient admission when he was younger roughly 28 or 89 and does not remember the incident surrounding that admission."  My mom takes care of all my medical history information."  Reports a family history related to mental illness.  States his mother was diagnosed with bipolar disorder.  States his sister is diagnosed with schizophrenia.  Unknown father side.  He reports he was prescribed Remeron however, does not recall taking the medications as indicated.  Does report taking Adderall as directed states it did help with focus.  Discussed following up for ADHD adult testing through [place] [facility] and/or [facility] for official diagnoses.  Discussed initiating Strattera 10 mg daily in the interim.  He was receptive to plan.  Patient is currently denying depression or depressive symptoms.  PHQ-9 912 GAD-7 10= total on questionnaire however questions were not answered?-See chart  During evaluation Nicholas Mcguire is sitting seen virtually through caregility; he is alert/oriented x 4; calm/cooperative; and mood congruent with affect.  Patient is speaking in a clear tone at moderate volume, and normal pace; with good eye contact. His thought process is coherent and relevant; There is no indication that he is currently responding to internal/external  stimuli or experiencing delusional thought content.  Patient denies suicidal/self-harm/homicidal ideation, psychosis, and paranoia.  Patient has remained calm throughout assessment and has answered questions appropriately.    Associated Signs/Symptoms: Depression Symptoms:  difficulty  concentrating, anxiety, (Hypo) Manic Symptoms:  Distractibility, Anxiety Symptoms:  Excessive Worry, Psychotic Symptoms:  Hallucinations: None PTSD Symptoms: NA  Past Psychiatric History:   Previous Psychotropic Medications: Yes   Substance Abuse History in the last 12 months:  No.  Consequences of Substance Abuse: NA  Past Medical History:  Past Medical History:  Diagnosis Date   ADHD (attention deficit hyperactivity disorder)    Asthma    exercise induced asthma - no longer on inhalers   Heart murmur    as a baby   Premature baby    Seasonal allergies    Vision abnormalities    lazy eye    Past Surgical History:  Procedure Laterality Date   ADENOIDECTOMY  age 28 mos.   PTOSIS REPAIR Left 09/01/2017   Procedure: PTOSIS REPAIR;  Surgeon: Nicholas Flock, MD;  Location: [facility] OR;  Service: Ophthalmology;  Laterality: Left;   TONSILLECTOMY  age 74 mos.    Family Psychiatric History: See HPI  Family History:  Family History  Problem Relation Age of Onset   Bipolar disorder Mother    Schizophrenia Sister    Seizures Sister     Social History:   Social History   Socioeconomic History   Marital status: Single    Spouse name: Not on file   Number of children: Not on file   Years of education: Not on file   Highest education level: Not on file  Occupational History   Not on file  Tobacco Use   Smoking status: Never   Smokeless tobacco: Never  Vaping Use   Vaping status: Never Used  Substance and Sexual Activity   Alcohol use: No   Drug use: No   Sexual activity: Never  Other Topics Concern   Not on file  Social History Narrative   Not on file   Social Drivers of Health   Financial Resource Strain: Not on file  Food Insecurity: Not on file  Transportation Needs: Not on file  Physical Activity: Not on file  Stress: Not on file  Social Connections: Not on file    Additional Social History: Reports he enjoys hiking and hanging out with friends.   Denies illicit substance and/or tobacco/vaping use.  Allergies:  No Known Allergies  Metabolic Disorder Labs: No results found for: "HGBA1C", "MPG" Lab Results  Component Value Date   PROLACTIN 12.5 07/18/2012   Lab Results  Component Value Date   CHOL 150 07/18/2012   TRIG 54 07/18/2012   HDL 60 07/18/2012   CHOLHDL 2.5 07/18/2012   VLDL 11 07/18/2012   LDLCALC 79 07/18/2012   Lab Results  Component Value Date   TSH 1.964 07/18/2012    Therapeutic Level Labs: No results found for: "LITHIUM" No results found for: "CBMZ" No results found for: "VALPROATE"  Current Medications: Current Outpatient Medications  Medication Sig Dispense Refill   atomoxetine (STRATTERA) 10 MG capsule Take 1 capsule (10 mg total) by mouth daily for 7 days, THEN 2 capsules (20 mg total) daily for 7 days. 21 capsule 0   No current facility-administered medications for this visit.    Musculoskeletal: Strength & Muscle Tone: within normal limits Gait & Station: normal Patient leans: N/A  Psychiatric Specialty Exam: Review of Systems  Psychiatric/Behavioral:  Positive for decreased concentration.  Negative for sleep disturbance and suicidal ideas. The patient is nervous/anxious.   All other systems reviewed and are negative.   There were no vitals taken for this visit.There is no height or weight on file to calculate BMI.  General Appearance: Casual  Eye Contact:  Good  Speech:  Clear and Coherent  Volume:  Normal  Mood:  Anxious  Affect:  Congruent  Thought Process:  Coherent  Orientation:  Full (Time, Place, and Person)  Thought Content:  Logical  Suicidal Thoughts:  No  Homicidal Thoughts:  No  Memory:  Immediate;   Good Recent;   Good  Judgement:  Good  Insight:  Good  Psychomotor Activity:  Normal  Concentration:  Concentration: Good  Recall:  Good  Fund of Knowledge:Good  Language: Good  Akathisia:  No  Handed:  Left  AIMS (if indicated):  done  Assets:  Communication  Skills Desire for Improvement Physical Health Resilience  ADL's:  Intact  Cognition: WNL  Sleep:  Good   Screenings: AUDIT    Flowsheet Row [facility]  Alcohol Use Disorder Identification Test Final Score (AUDIT) 0      PHQ2-9    Flowsheet Row [facility] Office Visit from 09[facility]14[facility]2013 [facility] Counselor from 04[facility]13/2013 in [facility] Total Score 0 1 0      Flowsheet Row ED from 08/13/2022 in [facility] RISK CATEGORY No Risk       Assessment and Plan: Burke Hazzard 22 year old male presents to reestablish care.  Reports he is seeking medication for attention deficit disorder.  States he was prescribed Adderall in the past and is interested in restarting this medication at this time.  Denies that he is currently followed by therapy or psychiatry.  States he was diagnosed with major depressive disorder and attention deficit disorder.  No concerns related to suicidal or homicidal ideations.  This provider discussed official adult ADHD testing for controlled medications.  He appeared receptive to plan.  Discussed initiating Strattera and/or clonidine for attention and focus.  He was receptive to trying medication until he is able to follow-up.  No concerns related to hypertension, seizure disorders or diabetes.  Collaboration of Care: Medication Management AEB patient to follow-up with [place] [facility] specialist and/or [facility] for ADHD testing.  Major depressive disorder in remission: Poor concentration:  Initiated Strattera 10 mg p.o. daily patient to follow-up x 1 month Consideration for Wellbutrin 150 mg daily, Trintellix and/or clonidine  Patient/Guardian was advised Release of Information must be  obtained prior to any record release in order to collaborate their care with an outside provider. Patient/Guardian was advised if they have not already done so to contact the registration department to sign all necessary forms in order for [place] to release information regarding their care.   Consent: Patient/Guardian gives verbal consent for treatment and assignment of benefits for services provided during this visit. Patient/Guardian expressed understanding and agreed to proceed.   Nicholas Rack, NP 12/17/20243:00 PM

## 2023-02-14 ENCOUNTER — Telehealth (HOSPITAL_COMMUNITY): Payer: BC Managed Care – PPO | Admitting: Family

## 2023-02-14 ENCOUNTER — Encounter (HOSPITAL_COMMUNITY): Payer: Self-pay | Admitting: Family

## 2023-02-14 DIAGNOSIS — R41840 Attention and concentration deficit: Secondary | ICD-10-CM | POA: Diagnosis not present

## 2023-02-14 MED ORDER — ATOMOXETINE HCL 40 MG PO CAPS: 40.0000 mg | ORAL_CAPSULE | Freq: Every day | ORAL | 2 refills | Status: AC

## 2023-02-14 NOTE — Progress Notes (Signed)
 Virtual Visit via Video Note  I connected with Nicholas Mcguire on 02/14/23 at  1:30 PM EST by a video enabled telemedicine application and verified that I am speaking with the correct person using two identifiers.  Location: Patient: Home  Provider: Office   I discussed the limitations of evaluation and management by telemedicine and the availability of in person appointments. The patient expressed understanding and agreed to proceed.   I discussed the assessment and treatment plan with the patient. The patient was provided an opportunity to ask questions and all were answered. The patient agreed with the plan and demonstrated an understanding of the instructions.   The patient was advised to call back or seek an in-person evaluation if the symptoms worsen or if the condition fails to improve as anticipated.  I provided 10 minutes of non-face-to-face time during this encounter.   Staci LOISE Kerns, NP   BH MD/PA/NP OP Progress Note  02/14/2023 1:55 PM OCTAVIANO MUKAI  MRN:  [id number]  Chief Complaint: Medication management follow-up   HPI: Nicholas Mcguire 23 year old African-American male presents for medication management follow-up appointment.  He reports he requested earlier follow-up appointment because he was out of Strattera  which was initiated due to reported symptoms of poor concentration, decreased energy and lack of focus.    Kipp reported a history with  attention deficit disorder states he was managed with Adderall in the past.  Provided additional outpatient resources for ADHD testing for adults.  Reported he has not been able to follow-up as of yet however is willing to try Strattera  to help manage his symptoms.  Decreased concentration: Increased Strattera  20 mg to Strattera  40 mg daily F/u 2 months and or as needed  This provider discussed initiating Wellbutrin however, patient declined at this time.  Will attempt to manage symptoms with atomoxetine  patient to follow-up  2 months for medication tolerability/efficacy.  He was receptive to plan support encouragement reassurance was provided.  Visit Diagnosis:    ICD-10-CM   1. Poor concentration  R41.840       Past Psychiatric History:   Past Medical History:  Past Medical History:  Diagnosis Date   ADHD (attention deficit hyperactivity disorder)    Asthma    exercise induced asthma - no longer on inhalers   Heart murmur    as a baby   Premature baby    Seasonal allergies    Vision abnormalities    lazy eye    Past Surgical History:  Procedure Laterality Date   ADENOIDECTOMY  age 48 mos.   PTOSIS REPAIR Left 09/01/2017   Procedure: PTOSIS REPAIR;  Surgeon: Laurie Loyd Redhead, MD;  Location: [facility] OR;  Service: Ophthalmology;  Laterality: Left;   TONSILLECTOMY  age 12 mos.    Family Psychiatric History:   Family History:  Family History  Problem Relation Age of Onset   Bipolar disorder Mother    Schizophrenia Sister    Seizures Sister     Social History:  Social History   Socioeconomic History   Marital status: Single    Spouse name: Not on file   Number of children: Not on file   Years of education: Not on file   Highest education level: Not on file  Occupational History   Not on file  Tobacco Use   Smoking status: Never   Smokeless tobacco: Never  Vaping Use   Vaping status: Never Used  Substance and Sexual Activity   Alcohol use: No   Drug use:  No   Sexual activity: Never  Other Topics Concern   Not on file  Social History Narrative   Not on file   Social Drivers of Health   Financial Resource Strain: Not on file  Food Insecurity: Not on file  Transportation Needs: Not on file  Physical Activity: Not on file  Stress: Not on file  Social Connections: Not on file    Allergies: No Known Allergies  Metabolic Disorder Labs: No results found for: HGBA1C, MPG Lab Results  Component Value Date   PROLACTIN 12.5 07/18/2012   Lab Results  Component Value Date    CHOL 150 07/18/2012   TRIG 54 07/18/2012   HDL 60 07/18/2012   CHOLHDL 2.5 07/18/2012   VLDL 11 07/18/2012   LDLCALC 79 07/18/2012   Lab Results  Component Value Date   TSH 1.964 07/18/2012    Therapeutic Level Labs: No results found for: LITHIUM No results found for: VALPROATE No results found for: CBMZ  Current Medications: Current Outpatient Medications  Medication Sig Dispense Refill   atomoxetine  (STRATTERA ) 40 MG capsule Take 1 capsule (40 mg total) by mouth daily. 60 capsule 2   atomoxetine  (STRATTERA ) 10 MG capsule Take 1 capsule (10 mg total) by mouth daily for 7 days, THEN 2 capsules (20 mg total) daily for 7 days. 21 capsule 0   No current facility-administered medications for this visit.     Musculoskeletal: Evaluated through caregility Psychiatric Specialty Exam: Review of Systems  Psychiatric/Behavioral:  Positive for decreased concentration. The patient is nervous/anxious.   All other systems reviewed and are negative.   There were no vitals taken for this visit.There is no height or weight on file to calculate BMI.  General Appearance: Casual  Eye Contact:  Good  Speech:  Clear and Coherent  Volume:  Normal  Mood:  Euthymic  Affect:  Congruent  Thought Process:  Coherent  Orientation:  Full (Time, Place, and Person)  Thought Content: Logical   Suicidal Thoughts:  No  Homicidal Thoughts:  No  Memory:  Immediate;   Good Recent;   Good  Judgement:  Good  Insight:  Good  Psychomotor Activity:  Normal  Concentration:  Concentration: Good  Recall:  Good  Fund of Knowledge: Good  Language: Good  Akathisia:  No  Handed:  Right  AIMS (if indicated): done  Assets:  Communication Skills Desire for Improvement Resilience Social Support  ADL's:  Intact  Cognition: WNL  Sleep:  Good   Screenings: AUDIT    Flowsheet Row [facility]  Alcohol Use Disorder  Identification Test Final Score (AUDIT) 0      PHQ2-9    Flowsheet Row [facility] Office Visit from 09/14/2013 [facility] Counselor from 04/13/2013 in [facility] Total Score 0 1 0      Flowsheet Row ED from 08/13/2022 in [facility] RISK CATEGORY No Risk        Assessment and Plan: Mahari Strahm presents for medication management appointment.  Per initial visit patient was initiated on Strattera  10 mg patient was to titrate to 20 mg daily.  Discussed he has been taking and tolerating medications well states he did not have any medication refill.  Discussed titrating Strattera  20 mg to 40 mg daily.  Patient was receptive to plan.  Changed pharmacy  to [facility].  Support, encouragement and reassurance was provided patient to follow-up 2 months.   Decreased concentration: Increased Strattera  20 mg to Strattera  40 mg daily F/u 2 months and or as needed  Collaboration of Care: Collaboration of Care: Medication Management AEB Increased Strattera  20 mg to 40 mg daily   Patient/Guardian was advised Release of Information must be obtained prior to any record release in order to collaborate their care with an outside provider. Patient/Guardian was advised if they have not already done so to contact the registration department to sign all necessary forms in order for us  to release information regarding their care.   Consent: Patient/Guardian gives verbal consent for treatment and assignment of benefits for services provided during this visit. Patient/Guardian expressed understanding and agreed to proceed.    Staci LOISE Kerns, NP 02/14/2023, 1:55 PM

## 2023-02-23 ENCOUNTER — Telehealth (HOSPITAL_COMMUNITY): Payer: BC Managed Care – PPO | Admitting: Family

## 2023-09-04 ENCOUNTER — Other Ambulatory Visit (HOSPITAL_COMMUNITY): Payer: Self-pay | Admitting: Family
